# Patient Record
Sex: Male | Born: 1980 | Race: White | Hispanic: No | Marital: Married | Smoking: Current every day smoker
Health system: Southern US, Community
[De-identification: ages and names within clinical notes are randomized; demographics above are authoritative.]

---

## 2017-07-01 ENCOUNTER — Emergency Department: Payer: Self-pay

## 2017-07-01 ENCOUNTER — Encounter: Payer: Self-pay | Admitting: Emergency Medicine

## 2017-07-01 ENCOUNTER — Emergency Department
Admission: EM | Admit: 2017-07-01 | Discharge: 2017-07-01 | Disposition: A | Discharge status: Home / Self Care | Payer: Self-pay | Attending: Emergency Medicine | Admitting: Emergency Medicine

## 2017-07-01 ENCOUNTER — Other Ambulatory Visit: Payer: Self-pay

## 2017-07-01 DIAGNOSIS — K529 Noninfective gastroenteritis and colitis, unspecified: Secondary | ICD-10-CM

## 2017-07-01 DIAGNOSIS — K5289 Other specified noninfective gastroenteritis and colitis: Secondary | ICD-10-CM | POA: Insufficient documentation

## 2017-07-01 LAB — COMPREHENSIVE METABOLIC PANEL
ALBUMIN: 5.2 g/dL — AB (ref 3.5–5.0)
ALK PHOS: 60 U/L (ref 38–126)
ALT: 39 U/L (ref 17–63)
ANION GAP: 13 (ref 5–15)
AST: 28 U/L (ref 15–41)
BUN: 13 mg/dL (ref 6–20)
CALCIUM: 10.9 mg/dL — AB (ref 8.9–10.3)
CO2: 24 mmol/L (ref 22–32)
Chloride: 101 mmol/L (ref 101–111)
Creatinine, Ser: 0.84 mg/dL (ref 0.61–1.24)
GFR calc non Af Amer: >60 mL/min (ref >60)
Glucose, Bld: 102 mg/dL — ABNORMAL HIGH (ref 65–99)
POTASSIUM: 4.7 mmol/L (ref 3.5–5.1)
SODIUM: 138 mmol/L (ref 135–145)
TOTAL PROTEIN: 9 g/dL — AB (ref 6.5–8.1)
Total Bilirubin: 1.3 mg/dL — ABNORMAL HIGH (ref 0.3–1.2)

## 2017-07-01 LAB — URINALYSIS, COMPLETE (UACMP) WITH MICROSCOPIC
Bacteria, UA: NONE SEEN
Bilirubin Urine: NEGATIVE
GLUCOSE, UA: NEGATIVE mg/dL
Hgb urine dipstick: NEGATIVE
Ketones, ur: 5 mg/dL — AB
Leukocytes, UA: NEGATIVE
Nitrite: NEGATIVE
PH: 5 (ref 5.0–8.0)
Protein, ur: NEGATIVE mg/dL
RBC / HPF: NONE SEEN RBC/hpf (ref 0–5)
SPECIFIC GRAVITY, URINE: 1.016 (ref 1.005–1.030)

## 2017-07-01 LAB — GASTROINTESTINAL PANEL BY PCR, STOOL (REPLACES STOOL CULTURE)
Adenovirus F40/41: NOT DETECTED
Astrovirus: NOT DETECTED
CRYPTOSPORIDIUM: NOT DETECTED
Campylobacter species: NOT DETECTED
Cyclospora cayetanensis: NOT DETECTED
ENTAMOEBA HISTOLYTICA: NOT DETECTED
ENTEROAGGREGATIVE E COLI (EAEC): NOT DETECTED
Enteropathogenic E coli (EPEC): DETECTED — AB
Enterotoxigenic E coli (ETEC): NOT DETECTED
GIARDIA LAMBLIA: NOT DETECTED
Norovirus GI/GII: NOT DETECTED
Plesimonas shigelloides: NOT DETECTED
Rotavirus A: NOT DETECTED
SALMONELLA SPECIES: NOT DETECTED
SHIGELLA/ENTEROINVASIVE E COLI (EIEC): NOT DETECTED
Sapovirus (I, II, IV, and V): NOT DETECTED
Shiga like toxin producing E coli (STEC): NOT DETECTED
VIBRIO CHOLERAE: NOT DETECTED
Vibrio species: NOT DETECTED
YERSINIA ENTEROCOLITICA: NOT DETECTED

## 2017-07-01 LAB — LIPASE, BLOOD: Lipase: 25 U/L (ref 11–51)

## 2017-07-01 LAB — CBC
HCT: 48.5 % (ref 40.0–52.0)
HEMOGLOBIN: 16.8 g/dL (ref 13.0–18.0)
MCH: 34 pg (ref 26.0–34.0)
MCHC: 34.5 g/dL (ref 32.0–36.0)
MCV: 98.5 fL (ref 80.0–100.0)
PLATELETS: 247 10*3/uL (ref 150–440)
RBC: 4.93 MIL/uL (ref 4.40–5.90)
RDW: 12.1 % (ref 11.5–14.5)
WBC: 12.3 10*3/uL — ABNORMAL HIGH (ref 3.8–10.6)

## 2017-07-01 MED ORDER — IOPAMIDOL (ISOVUE-300) INJECTION 61%
100.0000 mL | Freq: Once | INTRAVENOUS | Status: AC | PRN
  Administered 2017-07-01: 100 mL via INTRAVENOUS

## 2017-07-01 MED ORDER — ONDANSETRON 4 MG PO TBDP: 4.0000 mg | ORAL_TABLET | Freq: Three times a day (TID) | ORAL | 0 refills | Status: DC | PRN

## 2017-07-01 MED ORDER — ONDANSETRON HCL 4 MG/2ML IJ SOLN
4.0000 mg | Freq: Once | INTRAMUSCULAR | Status: AC
  Administered 2017-07-01: 4 mg via INTRAVENOUS
  Filled 2017-07-01: qty 2

## 2017-07-01 MED ORDER — IOPAMIDOL (ISOVUE-300) INJECTION 61%
30.0000 mL | Freq: Once | INTRAVENOUS | Status: AC | PRN
  Administered 2017-07-01: 30 mL via ORAL

## 2017-07-01 MED ORDER — SODIUM CHLORIDE 0.9 % IV SOLN
1000.0000 mL | Freq: Once | INTRAVENOUS | Status: AC
  Administered 2017-07-01: 1000 mL via INTRAVENOUS

## 2017-07-01 MED ORDER — CIPROFLOXACIN HCL 500 MG PO TABS: 500.0000 mg | ORAL_TABLET | Freq: Two times a day (BID) | ORAL | 0 refills | Status: DC

## 2017-07-01 NOTE — ED Notes (Signed)
First nurse: pt c/o abd pain for years but worse this am. NAD, pt ambulatory to triage.

## 2017-07-01 NOTE — ED Triage Notes (Signed)
Pt states abd pain for years but worse this am. C/o sharpe pain in generalized area.

## 2017-07-01 NOTE — ED Provider Notes (Signed)
[facility] Emergency Department Provider Note   ____________________________________________    I have reviewed the triage vital signs and the nursing notes.   HISTORY  Chief Complaint Abdominal Pain     HPI Dwayne Meyer is a 36 y.o. male who presents with complaints of abdominal pain, nausea and diarrhea.  Patient reports at approximately 1 AM he developed dry heaving followed by abdominal cramping primarily around his umbilicus and then foul-smelling diarrhea.  No sick contacts reported.  No recent travel.  He is not take anything for this.  He reports he has had a long history of "stomach problems "typically manifested by diarrhea.  No history of abdominal surgery.  No fevers reported.  He has not taken anything for this.   History reviewed. No pertinent past medical history.  There are no active problems to display for this patient.     Prior to Admission medications   Medication Sig Start Date End Date Taking? Authorizing Provider  ciprofloxacin (CIPRO) 500 MG tablet Take 1 tablet (500 mg total) by mouth 2 (two) times daily. 07/01/17   Jene EveryKinner, Jaleena Viviani, MD  ondansetron (ZOFRAN ODT) 4 MG disintegrating tablet Take 1 tablet (4 mg total) by mouth every 8 (eight) hours as needed for nausea or vomiting. 07/01/17   Jene EveryKinner, Elise Gladden, MD     Allergies Patient has no known allergies.  No family history on file.  Social History Does smoke, denies drug use  Review of Systems  Constitutional: No fever/chills Eyes: No visual changes.  ENT: No sore throat. Cardiovascular: Denies chest pain. Respiratory: Denies shortness of breath. Gastrointestinal: As above Genitourinary: Negative for dysuria. Musculoskeletal: Negative for back pain. Skin: Negative for rash. Neurological: Negative for headaches    ____________________________________________   PHYSICAL EXAM:  VITAL SIGNS: ED Triage Vitals  Enc Vitals Group     BP 07/01/17 0756 131/87       Pulse Rate 07/01/17 0756 92     Resp 07/01/17 0756 18     Temp 07/01/17 0756 98.5 F (36.9 C)     Temp Source 07/01/17 0756 Oral     SpO2 07/01/17 0756 100 %     Weight 07/01/17 0756 67.1 kg (148 lb)     Height 07/01/17 0756 1.93 m (6\' 4" )     Head Circumference --      Peak Flow --      Pain Score 07/01/17 0750 8     Pain Loc --      Pain Edu? --      Excl. in GC? --     Constitutional: Alert and oriented. No acute distress. Pleasant and interactive Eyes: Conjunctivae are normal.   Nose: No congestion/rhinnorhea. Mouth/Throat: Mucous membranes are moist.    Cardiovascular: Normal rate, regular rhythm. Grossly normal heart sounds.  Good peripheral circulation. Respiratory: Normal respiratory effort.  No retractions. Lungs CTAB. Gastrointestinal: Mild tenderness to palpation periumbilical. no distention.  No CVA tenderness. Genitourinary: deferred Musculoskeletal: No lower extremity tenderness nor edema.  Warm and well perfused Neurologic:  Normal speech and language. No gross focal neurologic deficits are appreciated.  Skin:  Skin is warm, dry and intact. No rash noted. Psychiatric: Mood and affect are normal. Speech and behavior are normal.  ____________________________________________   LABS (all labs ordered are listed, but only abnormal results are displayed)  Labs Reviewed  COMPREHENSIVE METABOLIC PANEL - Abnormal; Notable for the following components:      Result Value   Glucose, Bld 102 (*)  Calcium 10.9 (*)    Total Protein 9.0 (*)    Albumin 5.2 (*)    Total Bilirubin 1.3 (*)    All other components within normal limits  CBC - Abnormal; Notable for the following components:   WBC 12.3 (*)    All other components within normal limits  URINALYSIS, COMPLETE (UACMP) WITH MICROSCOPIC - Abnormal; Notable for the following components:   Color, Urine YELLOW (*)    APPearance CLEAR (*)    Ketones, ur 5 (*)    Squamous Epithelial / LPF 0-5 (*)    All other  components within normal limits  GASTROINTESTINAL PANEL BY PCR, STOOL (REPLACES STOOL CULTURE)  LIPASE, BLOOD   ____________________________________________  EKG  None ____________________________________________  RADIOLOGY  CT abdomen pelvis unremarkable____________________________________________   PROCEDURES  Procedure(s) performed: No  Procedures   Critical Care performed: No ____________________________________________   INITIAL IMPRESSION / ASSESSMENT AND PLAN / ED COURSE  Pertinent labs & imaging results that were available during my care of the patient were reviewed by me and considered in my medical decision making (see chart for details).  Patient presents with nausea, abdominal cramping and diarrhea.  Differential diagnosis includes viral gastroenteritis, no risk factors for small bowel obstruction but this is of course a possibility, severe constipation is also on the differential.  Lab work is overall reassuring mild elevation in white blood cell count.  Patient declined pain medication.  We will attempt to obtain sample of diarrhea and obtain abdominal imaging  CT scan unremarkable, patient felt better after fluids and nausea medication.  No longer in significant pain.  He would like to go home and I feel this is reasonable.  We will follow up on stool culture but treat presumptively with Cipro    ____________________________________________   FINAL CLINICAL IMPRESSION(S) / ED DIAGNOSES  Final diagnoses:  Gastroenteritis        Note:  This document was prepared using Dragon voice recognition software and may include unintentional dictation errors.    Jene EveryKinner, Trinton Prewitt, MD 07/01/17 [phone]

## 2017-07-01 NOTE — ED Notes (Signed)
Patient transported to CT 

## 2017-07-01 NOTE — ED Notes (Signed)
Pt back from CT

## 2018-09-14 IMAGING — CT CT ABD-PELV W/ CM
2 of 4 series · 16 of 46 positions shown, 18 images · IV contrast (APPLIED)
Comparison: None.

CLINICAL DATA: Nausea, diarrhea, and umbilical pain since earlier
today.

EXAM:
CT ABDOMEN AND PELVIS WITH CONTRAST
TECHNIQUE: Multidetector CT imaging of the abdomen and pelvis was performed
using the standard protocol following bolus administration of
intravenous contrast.
CONTRAST:  100mL TPSZ0B-8DD IOPAMIDOL (TPSZ0B-8DD) INJECTION 61%

[Series 2: routine abd/pel with · axial · 0.71mm/px · z∈[-1081,-661]mm · 13 of 94 slices shown, 15 images]
[im 5/94  soft-tissue]
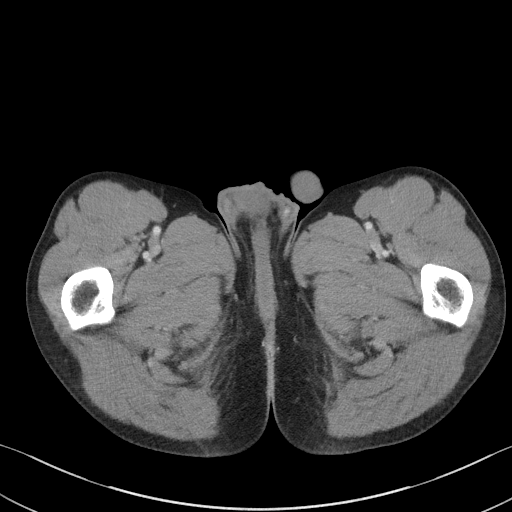
[im 5/94  bone]
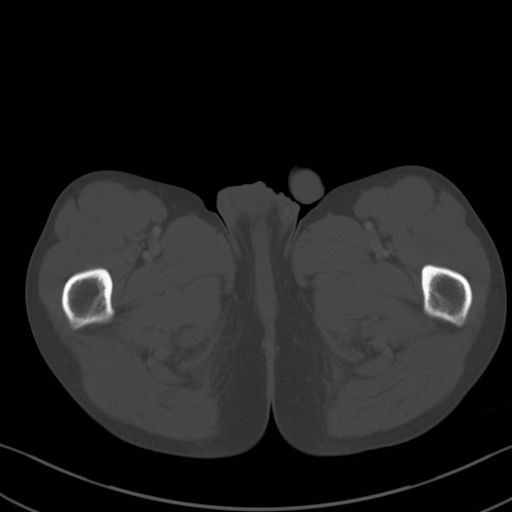
[im 13/94  soft-tissue]
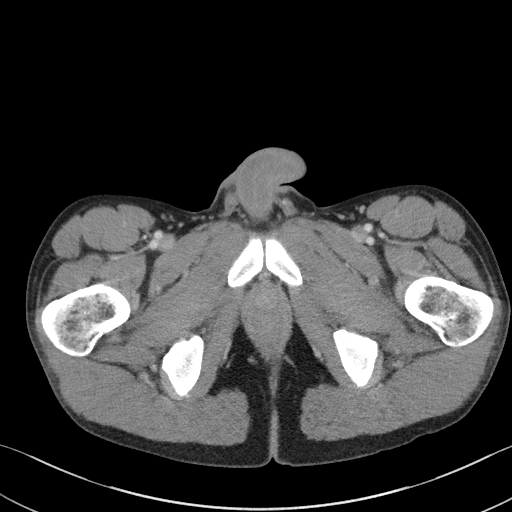
[im 21/94  soft-tissue]
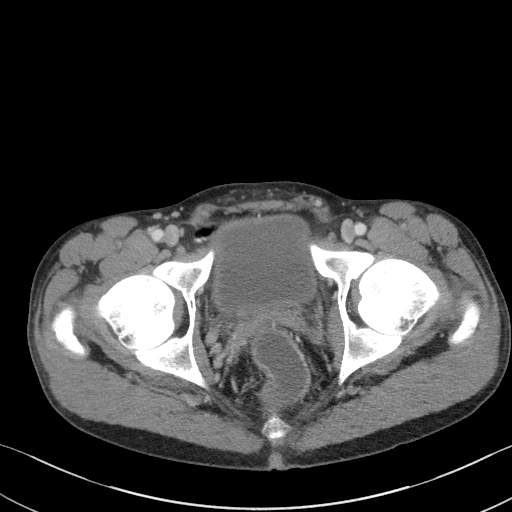
[im 25/94  soft-tissue]
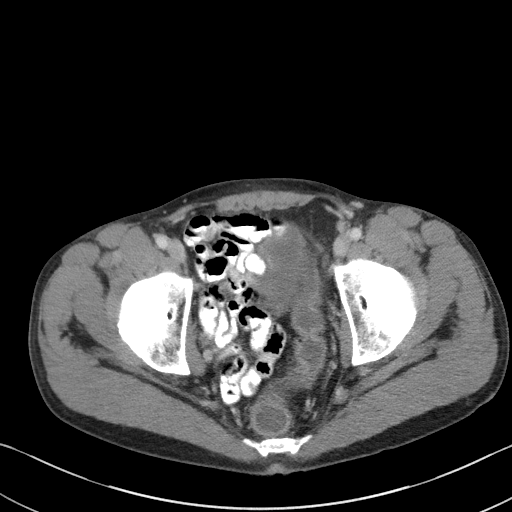
[im 33/94  soft-tissue]
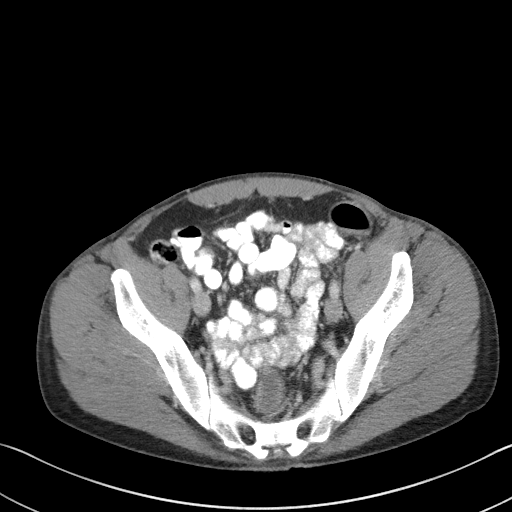
[im 41/94  soft-tissue]
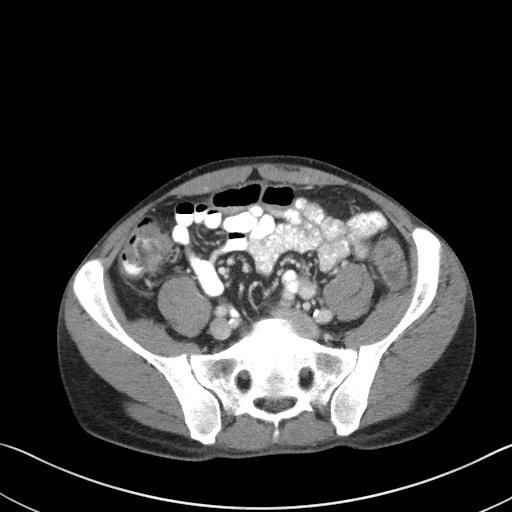
[im 49/94  soft-tissue]
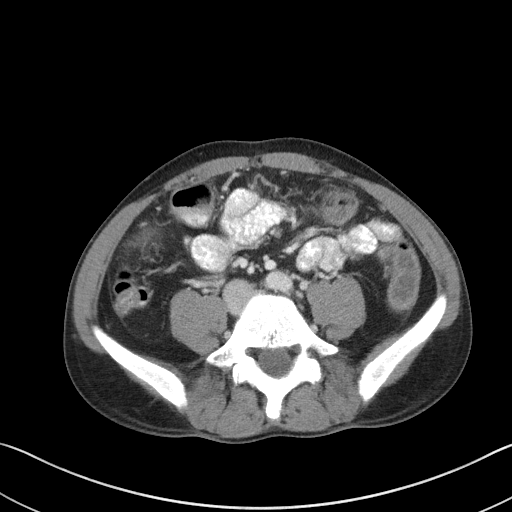
[im 53/94  soft-tissue]
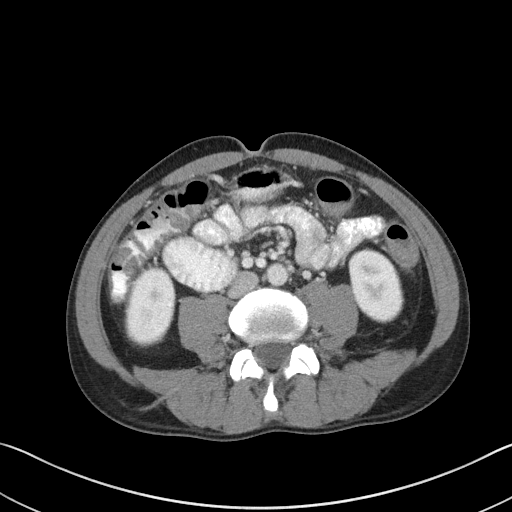
[im 61/94  soft-tissue]
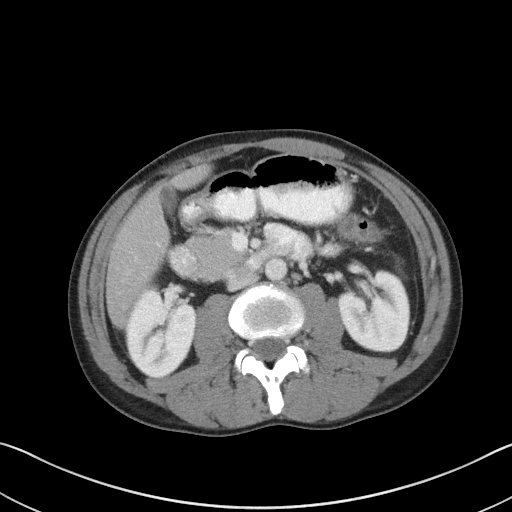
[im 61/94  bone]
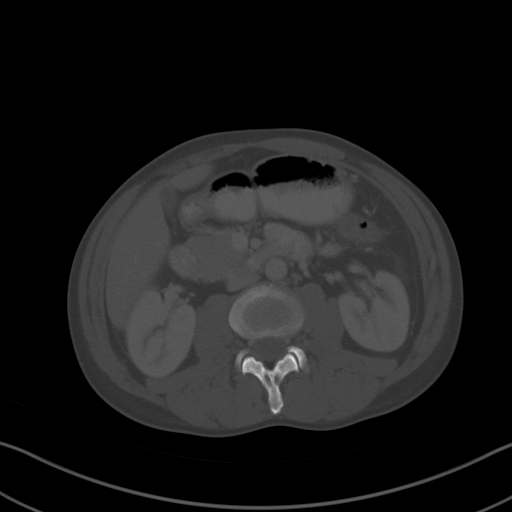
[im 69/94  soft-tissue]
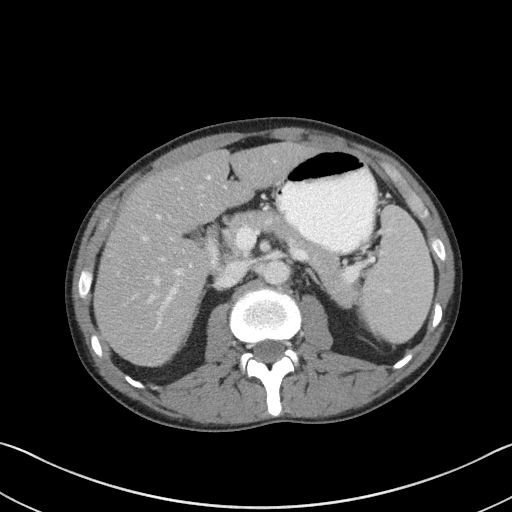
[im 73/94  soft-tissue]
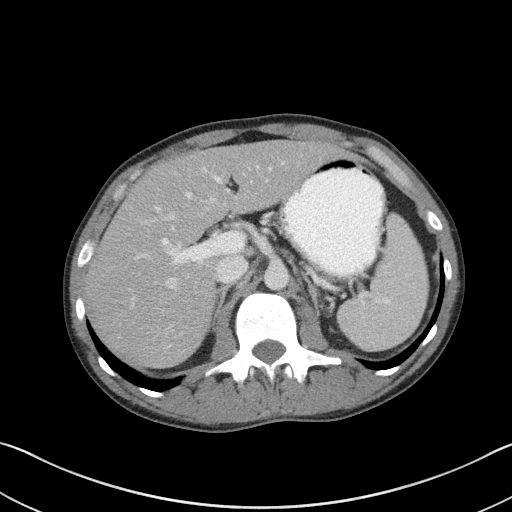
[im 81/94  soft-tissue]
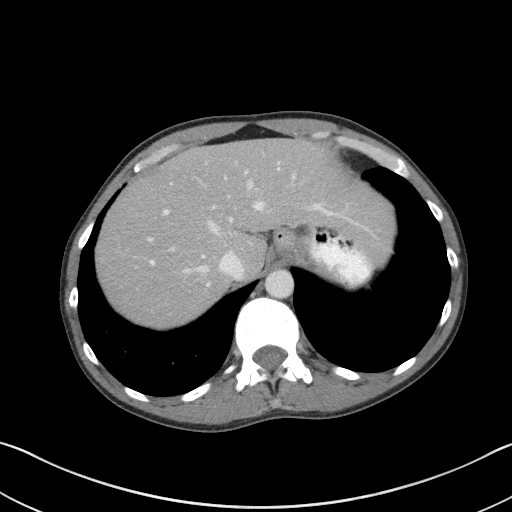
[im 89/94  soft-tissue]
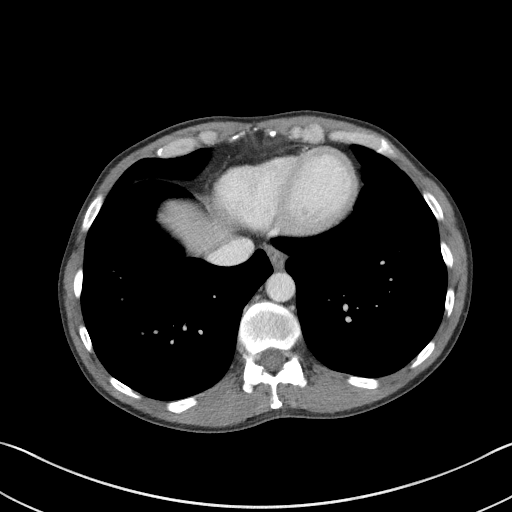

[Series 5: coronal st · coronal · 0.68mm/px · 3 of 86 slices shown]
[im 29/86  soft-tissue]
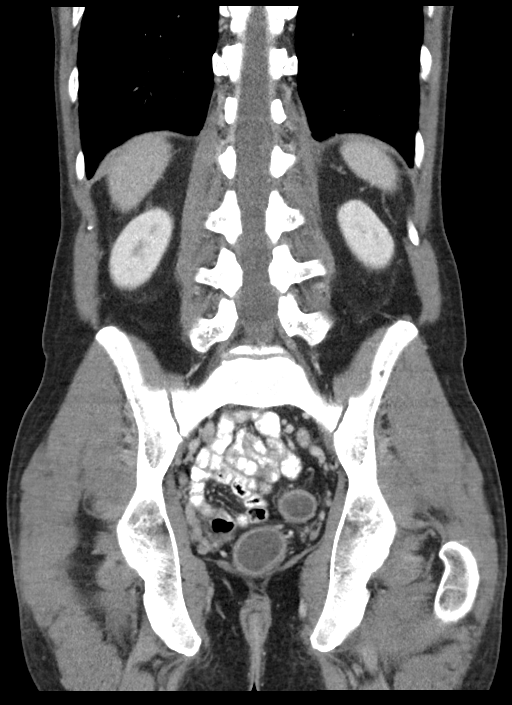
[im 38/86  soft-tissue]
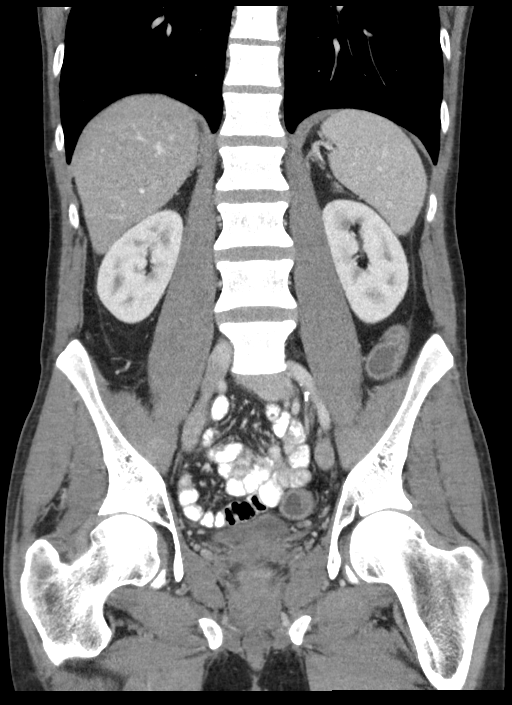
[im 48/86  soft-tissue]
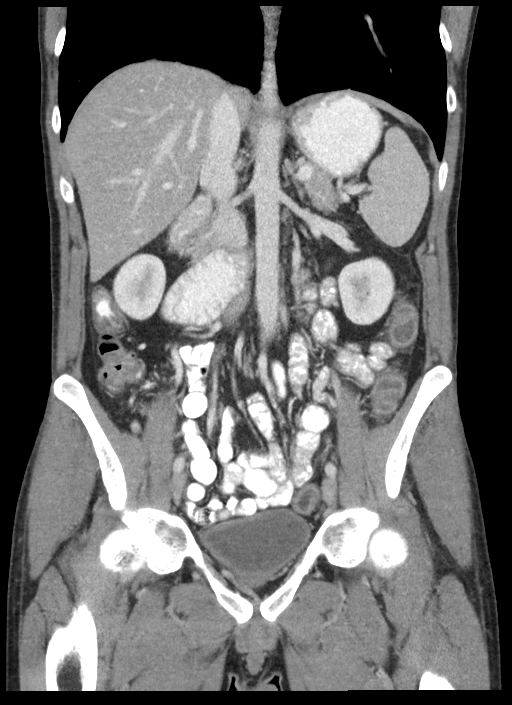

[16 of 46 positions shown; findings below may reference images not displayed]

FINDINGS: Lower chest: Negative.  No acute abnormality.

Hepatobiliary: No focal liver abnormality is seen. No gallstones,
gallbladder wall thickening, or biliary dilatation.

Pancreas: Unremarkable. No pancreatic ductal dilatation or
surrounding inflammatory changes.

Spleen: Normal.

Adrenals/Urinary Tract: Adrenal glands are unremarkable. Kidneys are
normal, without renal calculi, focal lesion, or hydronephrosis.
Bladder is unremarkable.

Stomach/Bowel: Stomach is within normal limits. Appendix appears
normal. No evidence of bowel wall thickening, distention, or
inflammatory changes. Fluid filled LEFT colon without wall
thickening.

Vascular/Lymphatic: No significant vascular findings are present. No
enlarged abdominal or pelvic lymph nodes.

Reproductive: Prostate is unremarkable.

Other: No abdominal wall hernia or abnormality. No abdominopelvic
ascites.

Musculoskeletal: No acute or significant osseous findings.
IMPRESSION: Unremarkable CT of the abdomen and pelvis. Appendix normal.
Fluid-filled LEFT colon without wall thickening or inflammation.

## 2018-12-06 ENCOUNTER — Emergency Department
Admission: EM | Admit: 2018-12-06 | Discharge: 2018-12-06 | Disposition: A | Discharge status: Home / Self Care | Payer: Self-pay | Attending: Emergency Medicine | Admitting: Emergency Medicine

## 2018-12-06 ENCOUNTER — Other Ambulatory Visit: Payer: Self-pay

## 2018-12-06 ENCOUNTER — Encounter: Payer: Self-pay | Admitting: Emergency Medicine

## 2018-12-06 DIAGNOSIS — L0201 Cutaneous abscess of face: Secondary | ICD-10-CM | POA: Insufficient documentation

## 2018-12-06 DIAGNOSIS — L0291 Cutaneous abscess, unspecified: Secondary | ICD-10-CM

## 2018-12-06 MED ORDER — LIDOCAINE HCL (PF) 1 % IJ SOLN
5.0000 mL | Freq: Once | INTRAMUSCULAR | Status: DC
  Filled 2018-12-06: qty 5

## 2018-12-06 MED ORDER — CEPHALEXIN 500 MG PO CAPS: 500.0000 mg | ORAL_CAPSULE | Freq: Three times a day (TID) | ORAL | 0 refills | Status: DC

## 2018-12-06 NOTE — Discharge Instructions (Addendum)
Continue to apply warm compress to the area.  Take the antibiotic as prescribed.  If the redness and swelling become worse please return the emergency department.  If you develop fever please return emergency department.  keep a bandage on the area when at work until completely healed.

## 2018-12-06 NOTE — ED Triage Notes (Signed)
L upper eyelid swelling and pain x 1 week.

## 2018-12-06 NOTE — ED Provider Notes (Signed)
[facility] Emergency Department Provider Note  ____________________________________________   First MD Initiated Contact with Patient 12/06/18 1217     (approximate)  I have reviewed the triage vital signs and the nursing notes.   HISTORY  Chief Complaint Eyelid Pain    HPI Dwayne Meyer is a 38 y.o. male presents emergency department complaining of an abscess above the left eye.  States it is been there for a week and is gotten larger and he cannot see out of the eye well due to the swelling.  He would just like for us to take care of it.  [facility] and they told put a warm compress on it.    History reviewed. No pertinent past medical history.  There are no active problems to display for this patient.   History reviewed. No pertinent surgical history.  Prior to Admission medications   Medication Sig Start Date End Date Taking? Authorizing Provider  cephALEXin (KEFLEX) 500 MG capsule Take 1 capsule (500 mg total) by mouth 3 (three) times daily. 12/06/18   Fisher, Roselyn BeringSusan W, PA-C  ciprofloxacin (CIPRO) 500 MG tablet Take 1 tablet (500 mg total) by mouth 2 (two) times daily. 07/01/17   Jene EveryKinner, Robert, MD  ondansetron (ZOFRAN ODT) 4 MG disintegrating tablet Take 1 tablet (4 mg total) by mouth every 8 (eight) hours as needed for nausea or vomiting. 07/01/17   Jene EveryKinner, Robert, MD    Allergies Patient has no known allergies.  No family history on file.  Social History Social History   Tobacco Use  . Smoking status: Not on file  Substance Use Topics  . Alcohol use: Not on file  . Drug use: Not on file    Review of Systems  Constitutional: No fever/chills Eyes: No visual changes. ENT: No sore throat. Respiratory: Denies cough Genitourinary: Negative for dysuria. Musculoskeletal: Negative for back pain. Skin: Negative for rash.  Positive for abscess above the left eye    ____________________________________________   PHYSICAL  EXAM:  VITAL SIGNS: ED Triage Vitals  Enc Vitals Group     BP 12/06/18 1059 (!) 159/91     Pulse Rate 12/06/18 1059 83     Resp 12/06/18 1059 20     Temp 12/06/18 1059 98 F (36.7 C)     Temp Source 12/06/18 1059 Oral     SpO2 12/06/18 1059 98 %     Weight 12/06/18 1101 148 lb (67.1 kg)     Height 12/06/18 1101 6\' 4"  (1.93 m)     Head Circumference --      Peak Flow --      Pain Score 12/06/18 1101 7     Pain Loc --      Pain Edu? --      Excl. in GC? --     Constitutional: Alert and oriented. Well appearing and in no acute distress. Eyes: Conjunctivae are normal.  Head: Atraumatic. Nose: No congestion/rhinnorhea. Mouth/Throat: Mucous membranes are moist.   Neck:  supple no lymphadenopathy noted Cardiovascular: Normal rate, regular rhythm. Heart sounds are normal Respiratory: Normal respiratory effort.  No retractions, lungs c t a  GU: deferred Musculoskeletal: FROM all extremities, warm and well perfused Neurologic:  Normal speech and language.  Skin:  Skin is warm, dry and intact.  Positive for a marble sized cystlike abscess noted just below the left brow Psychiatric: Mood and affect are normal. Speech and behavior are normal.  ____________________________________________   LABS (all labs ordered  are listed, but only abnormal results are displayed)  Labs Reviewed - No data to display ____________________________________________   ____________________________________________  RADIOLOGY    ____________________________________________   PROCEDURES  Procedure(s) performed:   Marland KitchenMarland KitchenIncision and Drainage Date/Time: 12/06/2018 3:45 PM Performed by: Faythe Ghee, PA-C Authorized by: Faythe Ghee, PA-C   Consent:    Consent obtained:  Verbal   Consent given by:  Patient   Risks discussed:  Incomplete drainage, infection and pain   Alternatives discussed:  Alternative treatment Location:    Type:  Abscess   Location:  Head   Head location:  Face  Pre-procedure details:    Skin preparation:  Betadine Anesthesia (see MAR for exact dosages):    Anesthesia method:  Local infiltration   Local anesthetic:  Lidocaine 1% w/o epi Procedure type:    Complexity:  Simple Procedure details:    Incision types:  Stab incision   Incision depth:  Subcutaneous   Scalpel blade:  11   Wound management:  Probed and deloculated   Drainage:  Bloody and purulent   Drainage amount:  Moderate   Wound treatment:  Wound left open   Packing materials:  None Post-procedure details:    Patient tolerance of procedure:  Tolerated well, no immediate complications      ____________________________________________   INITIAL IMPRESSION / ASSESSMENT AND PLAN / ED COURSE  Pertinent labs & imaging results that were available during my care of the patient were reviewed by me and considered in my medical decision making (see chart for details).   Patient is a 39 year old male presents emergency department with a cystlike abscess below the left brow.  Patient states he cannot wait for it to pop.  He wants [place] to cut it open.  Physical exam shows the abscess to be about the size of a marble with the left eyebrow.  Area is red and fluctuant.  See procedure note for I&D  Patient was given a prescription for Keflex 500 mg 3 times daily for 7 days.  He is to continue to apply warm compress.  Keep the area covered while at work.  Return if worsening.  States he understands will comply.  Was discharged stable condition.     As part of my medical decision making, I reviewed the following data within the electronic MEDICAL RECORD NUMBER Nursing notes reviewed and incorporated, Old chart reviewed, Notes from prior ED visits and [place] Controlled Substance Database  ____________________________________________   FINAL CLINICAL IMPRESSION(S) / ED DIAGNOSES  Final diagnoses:  Abscess      NEW MEDICATIONS STARTED DURING THIS VISIT:  Discharge Medication List as of  12/06/2018 12:59 PM    START taking these medications   Details  cephALEXin (KEFLEX) 500 MG capsule Take 1 capsule (500 mg total) by mouth 3 (three) times daily., Starting Sat 12/06/2018, Normal         Note:  This document was prepared using Dragon voice recognition software and may include unintentional dictation errors.    Faythe Ghee, PA-C 12/06/18 1546    Dionne Bucy, MD 12/07/18 [phone]

## 2018-12-06 NOTE — ED Notes (Addendum)
[facility] for his abd pain and eye and foot problems. Pt is here today for his eye - he has swelling above the left eye and was told to put hot compresses on it. The opth called today and said he could have a telemed or be seen in June. Pt does not want to wait. 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

## 2018-12-08 ENCOUNTER — Other Ambulatory Visit: Payer: Self-pay

## 2018-12-08 ENCOUNTER — Encounter: Payer: Self-pay | Admitting: Emergency Medicine

## 2018-12-08 ENCOUNTER — Emergency Department
Admission: EM | Admit: 2018-12-08 | Discharge: 2018-12-08 | Disposition: A | Discharge status: Home / Self Care | Payer: Self-pay | Attending: Emergency Medicine | Admitting: Emergency Medicine

## 2018-12-08 DIAGNOSIS — L0291 Cutaneous abscess, unspecified: Secondary | ICD-10-CM

## 2018-12-08 DIAGNOSIS — L0201 Cutaneous abscess of face: Secondary | ICD-10-CM | POA: Insufficient documentation

## 2018-12-08 MED ORDER — LIDOCAINE HCL (PF) 1 % IJ SOLN
5.0000 mL | Freq: Once | INTRAMUSCULAR | Status: AC
  Administered 2018-12-08: 11:00:00 5 mL via INTRADERMAL
  Filled 2018-12-08: qty 5

## 2018-12-08 MED ORDER — SULFAMETHOXAZOLE-TRIMETHOPRIM 800-160 MG PO TABS: 1.0000 | ORAL_TABLET | Freq: Two times a day (BID) | ORAL | 0 refills | Status: DC

## 2018-12-08 NOTE — ED Triage Notes (Signed)
States he is here for abscess recheck  States was placed on Keflex and thinks it is too strong   Area to left eye lid swollen

## 2018-12-08 NOTE — ED Provider Notes (Signed)
[facility] Emergency Department Provider Note  ____________________________________________  Time seen: Approximately 10:52 AM  I have reviewed the triage vital signs and the nursing notes.   HISTORY  Chief Complaint Abscess and Wound Check    HPI Dwayne Meyer is a 38 y.o. male presents emergency department for evaluation of abscess to left eyebrow that is worsening since incision and drainage was performed in this emergency department 2 days prior.  Patient also states that the Keflex does not make him feel well but is unable to articulate how it makes him feel.  Patient states that he has had a stye to this eyebrow for a long time but did not become enlarged or painful until a couple days prior.  Abscess was drained in the emergency department 2 days ago.  He states that swelling and pain improved for about 8 hours but then returned.  He is having difficulty opening his left eye due to the swelling.   History reviewed. No pertinent past medical history.  There are no active problems to display for this patient.   History reviewed. No pertinent surgical history.  Prior to Admission medications   Medication Sig Start Date End Date Taking? Authorizing Provider  cephALEXin (KEFLEX) 500 MG capsule Take 1 capsule (500 mg total) by mouth 3 (three) times daily. 12/06/18   Fisher, Roselyn Bering, PA-C  ondansetron (ZOFRAN ODT) 4 MG disintegrating tablet Take 1 tablet (4 mg total) by mouth every 8 (eight) hours as needed for nausea or vomiting. 07/01/17   Jene Every, MD  sulfamethoxazole-trimethoprim (BACTRIM DS) 800-160 MG tablet Take 1 tablet by mouth 2 (two) times daily. 12/08/18   Enid Derry, PA-C    Allergies Patient has no known allergies.  No family history on file.  Social History Social History   Tobacco Use  . Smoking status: Not on file  Substance Use Topics  . Alcohol use: Not on file  . Drug use: Not on file     Review of Systems   Constitutional: No fever/chills Cardiovascular: No chest pain. Respiratory: No SOB. Gastrointestinal: No abdominal pain.  No nausea, no vomiting.  Musculoskeletal: Negative for musculoskeletal pain. Skin: Negative for rash, abrasions, lacerations, ecchymosis.  Neurological: Negative for numbness or tingling   ____________________________________________   PHYSICAL EXAM:  VITAL SIGNS: ED Triage Vitals  Enc Vitals Group     BP 12/08/18 1014 (!) 148/98     Pulse Rate 12/08/18 1014 81     Resp 12/08/18 1014 18     Temp 12/08/18 1014 97.9 F (36.6 C)     Temp Source 12/08/18 1014 Oral     SpO2 12/08/18 1014 98 %     Weight 12/08/18 1014 142 lb (64.4 kg)     Height 12/08/18 1014 6\' 4"  (1.93 m)     Head Circumference --      Peak Flow --      Pain Score 12/08/18 1023 2     Pain Loc --      Pain Edu? --      Excl. in GC? --      Constitutional: Alert and oriented. Well appearing and in no acute distress. Eyes: Conjunctivae are normal. PERRL. EOMI.  Head: Atraumatic.  1 cm x 1 cm area of swelling and fluctuance to left eyebrow with some minor swelling to left upper eyelid.  ENT:      Ears:      Nose: No congestion/rhinnorhea.      Mouth/Throat: Mucous membranes are  moist.  Neck: No stridor. Cardiovascular: Normal rate, regular rhythm.  Good peripheral circulation. Respiratory: Normal respiratory effort without tachypnea or retractions. Lungs CTAB. Good air entry to the bases with no decreased or absent breath sounds. Gastrointestinal: Bowel sounds 4 quadrants. Soft and nontender to palpation. No guarding or rigidity. No palpable masses. No distention.  Musculoskeletal: Full range of motion to all extremities. No gross deformities appreciated. Neurologic:  Normal speech and language. No gross focal neurologic deficits are appreciated.  Skin:  Skin is warm, dry and intact. No rash noted. Psychiatric: Mood and affect are normal. Speech and behavior are normal. Patient exhibits  appropriate insight and judgement.   ____________________________________________   LABS (all labs ordered are listed, but only abnormal results are displayed)  Labs Reviewed - No data to display ____________________________________________  EKG   ____________________________________________  RADIOLOGY  No results found.  ____________________________________________    PROCEDURES  Procedure(s) performed:    Procedures  INCISION AND DRAINAGE Performed by: Enid DerryAshley Pratyush Ammon Consent: Verbal consent obtained. Risks and benefits: risks, benefits and alternatives were discussed Type: abscess  Body area: eyebrow  Anesthesia: local infiltration  Incision was made with a scalpel.  Local anesthetic: lidocaine 1 % without epinephrine  Anesthetic total: 4 ml  Complexity: complex Blunt dissection to break up loculations  Drainage: purulent  Drainage amount: mild  Packing material: 1/4 in iodoform gauze  Patient tolerance: Patient tolerated the procedure well with no immediate complications.    Medications  lidocaine (PF) (XYLOCAINE) 1 % injection 5 mL (5 mLs Intradermal Given by Other 12/08/18 1117)     ____________________________________________   INITIAL IMPRESSION / ASSESSMENT AND PLAN / ED COURSE  Pertinent labs & imaging results that were available during my care of the patient were reviewed by me and considered in my medical decision making (see chart for details).  Review of the [place] CSRS was performed in accordance of the [facility] prior to dispensing any controlled drugs.     Patient's diagnosis is consistent with abscess.  Abscess was drained and packed in the ED.  Abscess drained some purulent drainage and some drainage that was consistent with a cyst.  I suspect that this originated as a cyst that got infected.  Patient will follow-up with dermatology for removal of cyst if this returns.  Antibiotic will be switched from Keflex to Bactrim.  Patient  will be discharged home with prescriptions for Bactrim. Patient is to follow up with dermatology and primary care as directed. Patient is given ED precautions to return to the ED for any worsening or new symptoms.     ____________________________________________  FINAL CLINICAL IMPRESSION(S) / ED DIAGNOSES  Final diagnoses:  Abscess      NEW MEDICATIONS STARTED DURING THIS VISIT:  ED Discharge Orders         Ordered    sulfamethoxazole-trimethoprim (BACTRIM DS) 800-160 MG tablet  2 times daily     12/08/18 1143              This chart was dictated using voice recognition software/Dragon. Despite best efforts to proofread, errors can occur which can change the meaning. Any change was purely unintentional.    Enid DerryWagner, Moustafa Mossa, PA-C 12/08/18 1538    Sharyn CreamerQuale, Mark, MD 12/08/18 2249

## 2019-05-07 ENCOUNTER — Emergency Department
Admission: EM | Admit: 2019-05-07 | Discharge: 2019-05-07 | Disposition: A | Discharge status: Home / Self Care | Payer: Self-pay | Attending: Emergency Medicine | Admitting: Emergency Medicine

## 2019-05-07 ENCOUNTER — Other Ambulatory Visit: Payer: Self-pay

## 2019-05-07 ENCOUNTER — Encounter: Payer: Self-pay | Admitting: Emergency Medicine

## 2019-05-07 DIAGNOSIS — Y92009 Unspecified place in unspecified non-institutional (private) residence as the place of occurrence of the external cause: Secondary | ICD-10-CM | POA: Insufficient documentation

## 2019-05-07 DIAGNOSIS — Y999 Unspecified external cause status: Secondary | ICD-10-CM | POA: Insufficient documentation

## 2019-05-07 DIAGNOSIS — W311XXA Contact with metalworking machines, initial encounter: Secondary | ICD-10-CM | POA: Insufficient documentation

## 2019-05-07 DIAGNOSIS — Y9389 Activity, other specified: Secondary | ICD-10-CM | POA: Insufficient documentation

## 2019-05-07 DIAGNOSIS — S0501XA Injury of conjunctiva and corneal abrasion without foreign body, right eye, initial encounter: Secondary | ICD-10-CM | POA: Insufficient documentation

## 2019-05-07 DIAGNOSIS — F17200 Nicotine dependence, unspecified, uncomplicated: Secondary | ICD-10-CM | POA: Insufficient documentation

## 2019-05-07 MED ORDER — SULFACETAMIDE SODIUM 10 % OP SOLN: 2.0000 [drp] | Freq: Four times a day (QID) | OPHTHALMIC | 0 refills | Status: AC

## 2019-05-07 MED ORDER — EYE WASH OPHTH SOLN
1.0000 [drp] | OPHTHALMIC | Status: DC | PRN
  Filled 2019-05-07: qty 118

## 2019-05-07 MED ORDER — TETRACAINE HCL 0.5 % OP SOLN
2.0000 [drp] | Freq: Once | OPHTHALMIC | Status: AC
  Administered 2019-05-07: 10:00:00 2 [drp] via OPHTHALMIC
  Filled 2019-05-07: qty 4

## 2019-05-07 MED ORDER — FLUORESCEIN SODIUM 1 MG OP STRP
1.0000 | ORAL_STRIP | Freq: Once | OPHTHALMIC | Status: AC
  Administered 2019-05-07: 1 via OPHTHALMIC
  Filled 2019-05-07: qty 1

## 2019-05-07 NOTE — ED Notes (Signed)
VISION  Right  20/25 Left 20/20

## 2019-05-07 NOTE — ED Provider Notes (Signed)
[facility] Emergency Department Provider Note   ____________________________________________   First MD Initiated Contact with Patient 05/07/19 1018     (approximate)  I have reviewed the triage vital signs and the nursing notes.   HISTORY  Chief Complaint Eye Problem    HPI Dwayne Meyer is a 38 y.o. male patient complain of foreign body sensation.  Patient states he was using a metal grinder at home.  Patient rates his pain as a 1/10.  No palliative measures for complaint.     History reviewed. No pertinent past medical history.  There are no active problems to display for this patient.   History reviewed. No pertinent surgical history.  Prior to Admission medications   Medication Sig Start Date End Date Taking? Authorizing Provider  cephALEXin (KEFLEX) 500 MG capsule Take 1 capsule (500 mg total) by mouth 3 (three) times daily. Patient not taking: Reported on 05/07/2019 12/06/18   Versie Starks, PA-C  ondansetron (ZOFRAN ODT) 4 MG disintegrating tablet Take 1 tablet (4 mg total) by mouth every 8 (eight) hours as needed for nausea or vomiting. Patient not taking: Reported on 05/07/2019 07/01/17   Lavonia Drafts, MD  sulfacetamide (BLEPH-10) 10 % ophthalmic solution Place 2 drops into both eyes 4 (four) times daily for 10 days. 05/07/19 05/17/19  Sable Feil, PA-C  sulfamethoxazole-trimethoprim (BACTRIM DS) 800-160 MG tablet Take 1 tablet by mouth 2 (two) times daily. Patient not taking: Reported on 05/07/2019 12/08/18   Laban Emperor, PA-C    Allergies Patient has no known allergies.  No family history on file.  Social History Social History   Tobacco Use  . Smoking status: Current Every Day Smoker  . Smokeless tobacco: Never Used  Substance Use Topics  . Alcohol use: Not Currently  . Drug use: Not on file    Review of Systems Constitutional: No fever/chills Eyes: No visual changes.  Foreign body sensation in her right eye.  ENT: No sore throat. Cardiovascular: Denies chest pain. Respiratory: Denies shortness of breath. Gastrointestinal: No abdominal pain.  No nausea, no vomiting.  No diarrhea.  No constipation. Genitourinary: Negative for dysuria. Musculoskeletal: Negative for back pain. Skin: Negative for rash. Neurological: Negative for headaches, focal weakness or numbness.   ____________________________________________   PHYSICAL EXAM:  VITAL SIGNS: ED Triage Vitals  Enc Vitals Group     BP 05/07/19 0932 (!) 132/93     Pulse Rate 05/07/19 0932 90     Resp 05/07/19 0932 16     Temp 05/07/19 0932 98.2 F (36.8 C)     Temp Source 05/07/19 0932 Oral     SpO2 05/07/19 0932 96 %     Weight 05/07/19 0932 146 lb (66.2 kg)     Height 05/07/19 0932 6\' 4"  (1.93 m)     Head Circumference --      Peak Flow --      Pain Score 05/07/19 0935 1     Pain Loc --      Pain Edu? --      Excl. in [place]? --     Constitutional: Alert and oriented. Well appearing and in no acute distress. Eyes: Conjunctivae are normal. PERRL. EOMI. fluorostain reveal corneal abrasion without foreign body. Head: Atraumatic. Cardiovascular: Normal rate, regular rhythm. Grossly normal heart sounds.  Good peripheral circulation. Respiratory: Normal respiratory effort.  No retractions. Lungs CTAB. r are normal.  ____________________________________________   LABS (all labs ordered are listed, but only abnormal results are displayed)  Labs Reviewed - No data to display ____________________________________________  EKG   ____________________________________________  RADIOLOGY  ED MD interpretation:    Official radiology report(s): No results found.  ____________________________________________   PROCEDURES  Procedure(s) performed (including Critical Care):  Procedures   ____________________________________________   INITIAL IMPRESSION / ASSESSMENT AND PLAN / ED COURSE  As part of my medical decision  making, I reviewed the following data within the electronic MEDICAL RECORD NUMBER         Dwayne Meyer was evaluated in Emergency Department on 05/07/2019 for the symptoms described in the history of present illness. He was evaluated in the context of the global COVID-19 pandemic, which necessitated consideration that the patient might be at risk for infection with the SARS-CoV-2 virus that causes COVID-19. Institutional protocols and algorithms that pertain to the evaluation of patients at risk for COVID-19 are in a state of rapid change based on information released by regulatory bodies including the CDC and federal and state organizations. These policies and algorithms were followed during the patient's care in the ED.  Patient presents with right eye pain secondary to corneal abrasion.  Patient given discharge care instruction advised use eyedrops as directed.  Patient advised follow-up ophthalmology if no improvement in 3 to 5 days.  Return to ED if condition worsens.      ____________________________________________   FINAL CLINICAL IMPRESSION(S) / ED DIAGNOSES  Final diagnoses:  Abrasion of right cornea, initial encounter     ED Discharge Orders         Ordered    sulfacetamide (BLEPH-10) 10 % ophthalmic solution  4 times daily     05/07/19 1044           Note:  This document was prepared using Dragon voice recognition software and may include unintentional dictation errors.    Joni Reining, PA-C 05/07/19 1047    Arnaldo Natal, MD 05/07/19 [phone]

## 2019-05-07 NOTE — ED Notes (Signed)
See triage note  Presents with possible f/b in right eye   States he is unsure of what had gotten in his eye  Eye red and slightly swollen

## 2019-05-07 NOTE — ED Triage Notes (Signed)
Was grinding metal last night at home and something got in right eye.

## 2019-12-07 ENCOUNTER — Encounter: Payer: Self-pay | Admitting: Emergency Medicine

## 2019-12-07 ENCOUNTER — Other Ambulatory Visit: Payer: Self-pay

## 2019-12-07 ENCOUNTER — Emergency Department
Admission: EM | Admit: 2019-12-07 | Discharge: 2019-12-07 | Disposition: A | Discharge status: Home / Self Care | Payer: Self-pay | Attending: Emergency Medicine | Admitting: Emergency Medicine

## 2019-12-07 DIAGNOSIS — H02826 Cysts of left eye, unspecified eyelid: Secondary | ICD-10-CM

## 2019-12-07 DIAGNOSIS — H02824 Cysts of left upper eyelid: Secondary | ICD-10-CM | POA: Insufficient documentation

## 2019-12-07 DIAGNOSIS — F17210 Nicotine dependence, cigarettes, uncomplicated: Secondary | ICD-10-CM | POA: Insufficient documentation

## 2019-12-07 MED ORDER — LIDOCAINE-EPINEPHRINE-TETRACAINE (LET) TOPICAL GEL
3.0000 mL | Freq: Once | TOPICAL | Status: AC
  Administered 2019-12-07: 19:00:00 3 mL via TOPICAL
  Filled 2019-12-07: qty 3

## 2019-12-07 MED ORDER — BACITRACIN-NEOMYCIN-POLYMYXIN 400-5-5000 EX OINT
TOPICAL_OINTMENT | Freq: Once | CUTANEOUS | Status: AC
  Administered 2019-12-07: 1 via TOPICAL
  Filled 2019-12-07: qty 1

## 2019-12-07 MED ORDER — CEPHALEXIN 500 MG PO CAPS: 500.0000 mg | ORAL_CAPSULE | Freq: Three times a day (TID) | ORAL | 0 refills | Status: AC

## 2019-12-07 NOTE — ED Triage Notes (Signed)
Pt has had swelling over left eye in past that he was told was a infection in his oil gland.  Has small abscess like area over left eye. No fever or systemic sx. No vision changes.

## 2019-12-07 NOTE — ED Notes (Signed)
Pt states he has abscess on left eye brow. States same occurred last year requiring lancing. States today it became red and painful. Painful to touch. Site is red and inflamed over left eye

## 2019-12-07 NOTE — Discharge Instructions (Addendum)
Follow-up with your regular doctor if not improving in 3 days.  Return emergency department worsening.  Apply warm compresses to the area.  Take the antibiotic as prescribed.

## 2019-12-07 NOTE — ED Provider Notes (Signed)
[facility] Emergency Department Provider Note  ____________________________________________   First MD Initiated Contact with Patient 12/07/19 1909     (approximate)  I have reviewed the triage vital signs and the nursing notes.   HISTORY  Chief Complaint Abscess    HPI Dwayne Meyer is a 39 y.o. male presents emergency department with complaints of a "abscess "over the left eye.  Patient states he has had similar symptoms in the past.  States it became very swollen and red in the last 2 days.  No fever or chills.  No drainage from site.    History reviewed. No pertinent past medical history.  There are no problems to display for this patient.   History reviewed. No pertinent surgical history.  Prior to Admission medications   Medication Sig Start Date End Date Taking? Authorizing Provider  cephALEXin (KEFLEX) 500 MG capsule Take 1 capsule (500 mg total) by mouth 3 (three) times daily. 12/07/19   Versie Starks, PA-C    Allergies Patient has no known allergies.  History reviewed. No pertinent family history.  Social History Social History   Tobacco Use  . Smoking status: Current Every Day Smoker  . Smokeless tobacco: Never Used  Substance Use Topics  . Alcohol use: Not Currently  . Drug use: Not on file    Review of Systems  Constitutional: No fever/chills Eyes: No visual changes. ENT: No sore throat. Respiratory: Denies cough Cardiovascular: Denies chest pain Gastrointestinal: Denies abdominal pain Genitourinary: Negative for dysuria. Musculoskeletal: Negative for back pain. Skin: Negative for rash.  Positive for abscess Psychiatric: no mood changes,     ____________________________________________   PHYSICAL EXAM:  VITAL SIGNS: ED Triage Vitals [12/07/19 1840]  Enc Vitals Group     BP (!) 136/98     Pulse Rate (!) 103     Resp 18     Temp 98.3 F (36.8 C)     Temp Source Oral     SpO2 98 %     Weight 148 lb  (67.1 kg)     Height 6\' 4"  (1.93 m)     Head Circumference      Peak Flow      Pain Score 4     Pain Loc      Pain Edu?      Excl. in [place]?     Constitutional: Alert and oriented. Well appearing and in no acute distress. Eyes: Conjunctivae are normal.  Head: Atraumatic.  Positive for large sebaceous cyst noted below the left eyebrow Nose: No congestion/rhinnorhea. Mouth/Throat: Mucous membranes are moist.   Neck:  supple no lymphadenopathy noted Cardiovascular: Normal rate, regular rhythm.  Respiratory: Normal respiratory effort.  No retractions, GU: deferred Musculoskeletal: FROM all extremities, warm and well perfused Neurologic:  Normal speech and language.  Skin:  Skin is warm, dry and intact. No rash noted. Psychiatric: Mood and affect are normal. Speech and behavior are normal.  ____________________________________________   LABS (all labs ordered are listed, but only abnormal results are displayed)  Labs Reviewed - No data to display ____________________________________________   ____________________________________________  RADIOLOGY    ____________________________________________   PROCEDURES  Procedure(s) performed:   Marland KitchenMarland KitchenIncision and Drainage  Date/Time: 12/07/2019 7:59 PM Performed by: Versie Starks, PA-C Authorized by: Versie Starks, PA-C   Consent:    Consent obtained:  Verbal   Consent given by:  Patient   Risks discussed:  Bleeding, incomplete drainage, pain and infection   Alternatives discussed:  No  treatment Location:    Type:  Abscess   Location:  Head   Head location:  L eyelid Pre-procedure details:    Skin preparation:  Betadine Anesthesia (see MAR for exact dosages):    Anesthesia method:  Topical application   Topical anesthetic:  LET Procedure type:    Complexity:  Simple Procedure details:    Incision types:  Stab incision   Incision depth:  Dermal   Wound management:  Probed and deloculated   Drainage:  Bloody and  purulent   Drainage amount:  Scant   Wound treatment:  Wound left open   Packing materials:  None Post-procedure details:    Patient tolerance of procedure:  Tolerated well, no immediate complications      ____________________________________________   INITIAL IMPRESSION / ASSESSMENT AND PLAN / ED COURSE  Pertinent labs & imaging results that were available during my care of the patient were reviewed by me and considered in my medical decision making (see chart for details).   Patient is a 39 year old male presents emergency department for an abscess under the left eyebrow.  See HPI  Physical exam shows patient appear well.  There is a small inclusion cyst noted below the left eyebrow.  L ET was applied, see procedure note, dressing applied by nursing staff  Patient was given prescription for Keflex 500 3 times daily for 7 days.  He is to follow-up with a regular physician, surgeon, or return emergency department if worsening.  States he understands.  He is discharged in good condition.     Dwayne Meyer was evaluated in Emergency Department on 12/07/2019 for the symptoms described in the history of present illness. He was evaluated in the context of the global COVID-19 pandemic, which necessitated consideration that the patient might be at risk for infection with the SARS-CoV-2 virus that causes COVID-19. Institutional protocols and algorithms that pertain to the evaluation of patients at risk for COVID-19 are in a state of rapid change based on information released by regulatory bodies including the CDC and federal and state organizations. These policies and algorithms were followed during the patient's care in the ED.   As part of my medical decision making, I reviewed the following data within the electronic MEDICAL RECORD NUMBER Nursing notes reviewed and incorporated, Old chart reviewed, Notes from prior ED visits and [place] Controlled Substance  Database  ____________________________________________   FINAL CLINICAL IMPRESSION(S) / ED DIAGNOSES  Final diagnoses:  Sebaceous cyst of left eyelid      NEW MEDICATIONS STARTED DURING THIS VISIT:  New Prescriptions   CEPHALEXIN (KEFLEX) 500 MG CAPSULE    Take 1 capsule (500 mg total) by mouth 3 (three) times daily.     Note:  This document was prepared using Dragon voice recognition software and may include unintentional dictation errors.    Faythe Ghee, PA-C 12/07/19 Mikki Santee, MD 12/07/19 2012

## 2019-12-08 ENCOUNTER — Other Ambulatory Visit: Payer: Self-pay

## 2019-12-08 ENCOUNTER — Emergency Department
Admission: EM | Admit: 2019-12-08 | Discharge: 2019-12-08 | Disposition: A | Discharge status: Home / Self Care | Payer: Self-pay | Attending: Emergency Medicine | Admitting: Emergency Medicine

## 2019-12-08 ENCOUNTER — Encounter: Payer: Self-pay | Admitting: Emergency Medicine

## 2019-12-08 DIAGNOSIS — F17210 Nicotine dependence, cigarettes, uncomplicated: Secondary | ICD-10-CM | POA: Insufficient documentation

## 2019-12-08 DIAGNOSIS — Z5189 Encounter for other specified aftercare: Secondary | ICD-10-CM

## 2019-12-08 DIAGNOSIS — Z4801 Encounter for change or removal of surgical wound dressing: Secondary | ICD-10-CM | POA: Insufficient documentation

## 2019-12-08 DIAGNOSIS — L02811 Cutaneous abscess of head [any part, except face]: Secondary | ICD-10-CM | POA: Insufficient documentation

## 2019-12-08 MED ORDER — CEPHALEXIN 500 MG PO CAPS
500.0000 mg | ORAL_CAPSULE | Freq: Once | ORAL | Status: AC
  Administered 2019-12-08: 500 mg via ORAL
  Filled 2019-12-08: qty 1

## 2019-12-08 NOTE — ED Notes (Signed)
See triage note  Presents for wound check  States eyelid abscess drained yesterday

## 2019-12-08 NOTE — ED Provider Notes (Signed)
[facility] Emergency Department Provider Note  ____________________________________________   First MD Initiated Contact with Patient 12/08/19 [phone]     (approximate)  I have reviewed the triage vital signs and the nursing notes.   HISTORY  Chief Complaint Wound Check   HPI Avir Deruiter is a 39 y.o. male presents to the ED for recheck of an abscess over his left brow.  Patient has not gotten his antibiotic filled yet.  He states there is more swelling this morning than yesterday.  He denies any fever or chills.  He rates pain as 3 out of 10.       History reviewed. No pertinent past medical history.  There are no problems to display for this patient.   History reviewed. No pertinent surgical history.  Prior to Admission medications   Medication Sig Start Date End Date Taking? Authorizing Provider  cephALEXin (KEFLEX) 500 MG capsule Take 1 capsule (500 mg total) by mouth 3 (three) times daily. 12/07/19   Faythe Ghee, PA-C    Allergies Patient has no known allergies.  History reviewed. No pertinent family history.  Social History Social History   Tobacco Use  . Smoking status: Current Every Day Smoker  . Smokeless tobacco: Never Used  Substance Use Topics  . Alcohol use: Not Currently  . Drug use: Not on file    Review of Systems Constitutional: No fever/chills Eyes: Decreased vision from his left eye secondary to soft tissue edema. ENT: No sore throat. Cardiovascular: Denies chest pain. Respiratory: Denies shortness of breath. Gastrointestinal: No nausea, no vomiting.  Musculoskeletal: Negative for muscle aches. Skin: Abscess left eyebrow. Neurological: Negative for headaches, focal weakness or numbness. ____________________________________________   PHYSICAL EXAM:  VITAL SIGNS: ED Triage Vitals  Enc Vitals Group     BP 12/08/19 0921 (!) 140/98     Pulse Rate 12/08/19 0921 (!) 110     Resp 12/08/19 0921 20     Temp  12/08/19 0921 98.5 F (36.9 C)     Temp Source 12/08/19 0921 Oral     SpO2 12/08/19 0921 98 %     Weight 12/08/19 0922 148 lb (67.1 kg)     Height 12/08/19 0922 6\' 4"  (1.93 m)     Head Circumference --      Peak Flow --      Pain Score 12/08/19 0922 3     Pain Loc --      Pain Edu? --      Excl. in [place]? --    Constitutional: Alert and oriented. Well appearing and in no acute distress. Eyes: Conjunctivae are normal. PERRL. EOMI. below the left eyebrow there is a small area of erythema which is firm, markedly tender and erythematous.  No drainage noted.  I&D yesterday.  No drain was placed in this area. Head: Atraumatic. Nose: No congestion/rhinnorhea. Neck: No stridor.   Cardiovascular: Normal rate, regular rhythm. Grossly normal heart sounds.  Good peripheral circulation. Respiratory: Normal respiratory effort.  No retractions. Lungs CTAB. Neurologic:  Normal speech and language. No gross focal neurologic deficits are appreciated. No gait instability. Skin:  Skin is warm, dry.  Skin as noted above. Psychiatric: Mood and affect are normal. Speech and behavior are normal.  ____________________________________________   LABS (all labs ordered are listed, but only abnormal results are displayed)  Labs Reviewed - No data to display  PROCEDURES  Procedure(s) performed (including Critical Care):  Procedures   ____________________________________________   INITIAL IMPRESSION / ASSESSMENT AND  PLAN / ED COURSE  As part of my medical decision making, I reviewed the following data within the electronic MEDICAL RECORD NUMBER Notes from prior ED visits and [place] Controlled Substance Database  39 year old male presents to the ED after being seen yesterday for recheck of his abscess to the left eyebrow.  Patient has not started his antibiotic but was concerned this morning because swelling has increased.  He denies any fever, chills, nausea or vomiting.  Area is erythematous and markedly tender.   No fluctuance was noted.  Patient is encouraged to use warm compresses to the area and he was given his first antibiotic while in the ED that was prescribed yesterday.  He was also given a good Rx card to decrease the cost of his antibiotic.  He is encouraged to get this prescription today and begin taking it as directed.  He is to return to the emergency department if any severe worsening of his symptoms or if this area becomes soft so that we can open and drain it.  ____________________________________________   FINAL CLINICAL IMPRESSION(S) / ED DIAGNOSES  Final diagnoses:  Wound check, abscess     ED Discharge Orders    None       Note:  This document was prepared using Dragon voice recognition software and may include unintentional dictation errors.    Johnn Hai, PA-C 12/08/19 1219    Duffy Bruce, MD 12/10/19 1422

## 2019-12-08 NOTE — Discharge Instructions (Addendum)
Use the good Rx card to get your prescription filled this morning.  The first dose was given to you while in the ED.  Use warm compresses to your face frequently to help reduce the swelling.  Return to the emergency department if any severe worsening of your symptoms such as fever, chills are increased swelling.

## 2019-12-08 NOTE — ED Triage Notes (Signed)
Pt reports had his left eyelid drained yesterday here and came back for a recheck. Swelling noted to the left eyelid. Pt not able to get abx filled yet.

## 2023-11-05 ENCOUNTER — Other Ambulatory Visit: Payer: Self-pay

## 2023-11-05 ENCOUNTER — Emergency Department
Admission: EM | Admit: 2023-11-05 | Discharge: 2023-11-05 | Disposition: A | Discharge status: Home / Self Care | Payer: Self-pay | Attending: Emergency Medicine | Admitting: Emergency Medicine

## 2023-11-05 DIAGNOSIS — K5900 Constipation, unspecified: Secondary | ICD-10-CM | POA: Insufficient documentation

## 2023-11-05 DIAGNOSIS — K4090 Unilateral inguinal hernia, without obstruction or gangrene, not specified as recurrent: Secondary | ICD-10-CM | POA: Insufficient documentation

## 2023-11-05 LAB — CBC
HCT: 43.2 % (ref 39.0–52.0)
Hemoglobin: 16.2 g/dL (ref 13.0–17.0)
MCH: 40.3 pg — ABNORMAL HIGH (ref 26.0–34.0)
MCHC: 37.5 g/dL — ABNORMAL HIGH (ref 30.0–36.0)
MCV: 107.5 fL — ABNORMAL HIGH (ref 80.0–100.0)
Platelets: 139 10*3/uL — ABNORMAL LOW (ref 150–400)
RBC: 4.02 MIL/uL — ABNORMAL LOW (ref 4.22–5.81)
RDW: 14.1 % (ref 11.5–15.5)
WBC: 4.3 10*3/uL (ref 4.0–10.5)
nRBC: 0 % (ref 0.0–0.2)

## 2023-11-05 LAB — COMPREHENSIVE METABOLIC PANEL WITH GFR
ALT: 143 U/L — ABNORMAL HIGH (ref 0–44)
AST: 203 U/L — ABNORMAL HIGH (ref 15–41)
Albumin: 3.9 g/dL (ref 3.5–5.0)
Alkaline Phosphatase: 85 U/L (ref 38–126)
Anion gap: 12 (ref 5–15)
BUN: 5 mg/dL — ABNORMAL LOW (ref 6–20)
CO2: 29 mmol/L (ref 22–32)
Calcium: 9.5 mg/dL (ref 8.9–10.3)
Chloride: 90 mmol/L — ABNORMAL LOW (ref 98–111)
Creatinine, Ser: 0.71 mg/dL (ref 0.61–1.24)
GFR, Estimated: >60 mL/min (ref >60)
Glucose, Bld: 131 mg/dL — ABNORMAL HIGH (ref 70–99)
Potassium: 3.5 mmol/L (ref 3.5–5.1)
Sodium: 131 mmol/L — ABNORMAL LOW (ref 135–145)
Total Bilirubin: 2.5 mg/dL — ABNORMAL HIGH (ref 0.0–1.2)
Total Protein: 7.5 g/dL (ref 6.5–8.1)

## 2023-11-05 LAB — LIPASE, BLOOD: Lipase: 37 U/L (ref 11–51)

## 2023-11-05 MED ORDER — NAPROXEN 500 MG PO TABS: 500.0000 mg | ORAL_TABLET | Freq: Two times a day (BID) | ORAL | 0 refills | Status: AC

## 2023-11-05 NOTE — ED Triage Notes (Signed)
 Pt presents to the ED with lower abdominal pain x4 weeks. States that the pain is stinging and pressure in nature. Pt A&Ox4 at time of triage. VSS. Pt ambulatory.

## 2023-11-05 NOTE — Discharge Instructions (Addendum)
 Buy over-the-counter Senokot-S (senna-docusate 8.6-50mg ) and take 2 twice a day for constipation. Buy OTC Famotidine 20mg  tablet and take 1 two times per day for stomach acid. Take naproxen 500mg  two times per day for pain.  You can also use OTC lidocaine patches to apply over the lower abdominal wall to alleviate pain.  Follow up with the GI team as arranged by [facility], and follow up with general surgery regarding your inguinal hernia.

## 2023-11-05 NOTE — ED Notes (Signed)
 See triage note  Presents with some abd pain  States he has had this pain for several weeks  No fever

## 2023-11-05 NOTE — ED Provider Notes (Signed)
 [facility] Provider Note    Event Date/Time   First MD Initiated Contact with Patient 11/05/23 1606     (approximate)   History   Chief Complaint: Abdominal Pain   HPI  Dwayne Meyer is a 43 y.o. male with history who comes ED complaining of lower abdominal pain for the past several weeks.  Feels somewhat radiating to the right flank.  Associated with intermittent dark urine.  Feels like he is drinking enough water, but eating intermittently.  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  He does routinely lift heavy amounts of weight.  Also reports frequent constipation and having to strain forcefully to have a bowel movement, which also worsens his low abdominal pain.  Denies chest pain or shortness of breath.  Denies any bulging or testicular pain.  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  Also reports most days waking up and having to dry heave or vomit 1 time before he is able to eat or drink water.  Has decreased appetite generally.  Also has sometimes a dry cough and bitter taste in his mouth and sore throat with waking up in the morning.  Was seen 2 days ago in [facility] ED, had labs, CT AP. Outside records reviewed: IMPRESSION: No acute findings in the abdomen or pelvis. The appendix is normal. Exam End: 11/03/23 15:13           Physical Exam   Triage Vital Signs: ED Triage Vitals  Encounter Vitals Group     BP 11/05/23 1538 (!) 155/107     Systolic BP Percentile --      Diastolic BP Percentile --      Pulse Rate 11/05/23 1538 (!) 102     Resp 11/05/23 1538 18     Temp 11/05/23 1538 97.9 F (36.6 C)     Temp Source 11/05/23 1538 Oral     SpO2 11/05/23 1538 100 %     Weight 11/05/23 1539 143 lb (64.9 kg)     Height 11/05/23 1539 6\' 4"  (1.93 m)     Head Circumference --      Peak Flow --      Pain Score 11/05/23 1539 9     Pain Loc --      Pain  Education --      Exclude from Growth Chart --     Most recent vital signs: Vitals:   11/05/23 1538  BP: (!) 155/107  Pulse: (!) 102  Resp: 18  Temp: 97.9 F (36.6 C)  SpO2: 100%    General: Awake, no distress.  CV:  Good peripheral perfusion.  Regular rate and rhythm, normal distal pulses Resp:  Normal effort.  Abd:  No distention.  Soft, generally nontender.  No bulging.  No CVA tenderness there is a palpable abdominal wall defect at Hesselbach's triangle on the right, with tenderness at this location.   Other:  Scrotum and testicular exam is normal.   ED Results / Procedures / Treatments   Labs (all labs ordered are listed, but only abnormal results are displayed) Labs Reviewed  COMPREHENSIVE METABOLIC PANEL WITH GFR - Abnormal; Notable for the following components:      Result Value   Sodium 131 (*)    Chloride 90 (*)    Glucose, Bld 131 (*)    BUN 5 (*)    AST 203 (*)    ALT  143 (*)    Total Bilirubin 2.5 (*)    All other components within normal limits  CBC - Abnormal; Notable for the following components:   RBC 4.02 (*)    MCV 107.5 (*)    MCH 40.3 (*)    MCHC 37.5 (*)    Platelets 139 (*)    All other components within normal limits  LIPASE, BLOOD  URINALYSIS, ROUTINE W REFLEX MICROSCOPIC     EKG    RADIOLOGY CT a/p from [facility] 2 days ago report reviewed, no acute findings.   PROCEDURES:  Procedures   MEDICATIONS ORDERED IN ED: Medications - No data to display   IMPRESSION / MDM / ASSESSMENT AND PLAN / ED COURSE  I reviewed the triage vital signs and the nursing notes.  DDx: Dehydration, constipation, symptomatic hernia, hyperbilirubinemia, GERD  Patient's presentation is most consistent with acute presentation with potential threat to life or bodily function.  Patient presents with multiple complaints.  Low abdominal pain - appears to have a direct inguinal hernia on the right, palpable on exam, without any bulging or entrapped tissue.   CT from 2 days ago unremarkable, exam today not concerning for any new development.  Recommend outpatient follow-up with general surgery.  Naproxen twice daily.  Labs today congruent with 2 days ago at [facility].  Referred to gastroenterology at [facility] already.  Constipation -recommend Senokot, avoid straining, which may also be contributing to hernia pain.  GU -primary care referral.  Counseled patient on increased risk of testicular cancer associated with undescended testicles and importance of performing testicular self exams on a regular basis  GERD -start Pepcid twice daily.       FINAL CLINICAL IMPRESSION(S) / ED DIAGNOSES   Final diagnoses:  Constipation, unspecified constipation type  Direct inguinal hernia of right side     Rx / DC Orders   ED Discharge Orders          Ordered    Ambulatory Referral to Primary Care (Establish Care)        11/05/23 1632    naproxen (NAPROSYN) 500 MG tablet  2 times daily with meals        11/05/23 1638             Note:  This document was prepared using Dragon voice recognition software and may include unintentional dictation errors.   Sharman Cheek, MD 11/05/23 [phone]

## 2023-11-18 ENCOUNTER — Ambulatory Visit: Payer: Self-pay | Admitting: Surgery
